# Patient Record
Sex: Male | Born: 1990 | Race: Black or African American | Hispanic: No | Marital: Single | Smoking: Current every day smoker
Health system: Southern US, Community
[De-identification: ages and names within clinical notes are randomized; demographics above are authoritative.]

## PROBLEM LIST (undated history)

## (undated) HISTORY — PX: MOUTH SURGERY: SHX715

---
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
## 2011-06-17 ENCOUNTER — Encounter (HOSPITAL_COMMUNITY): Payer: Self-pay

## 2011-06-17 ENCOUNTER — Emergency Department (HOSPITAL_COMMUNITY)
Admission: EM | Admit: 2011-06-17 | Discharge: 2011-06-18 | Disposition: A | Discharge status: Home / Self Care | Payer: Managed Care, Other (non HMO) | Attending: Emergency Medicine | Admitting: Emergency Medicine

## 2011-06-17 DIAGNOSIS — M546 Pain in thoracic spine: Secondary | ICD-10-CM | POA: Insufficient documentation

## 2011-06-17 MED ORDER — IBUPROFEN 200 MG PO TABS
600.0000 mg | ORAL_TABLET | Freq: Once | ORAL | Status: AC
  Administered 2011-06-17: 600 mg via ORAL

## 2011-06-17 MED ORDER — CYCLOBENZAPRINE HCL 10 MG PO TABS: 10.0000 mg | ORAL_TABLET | Freq: Two times a day (BID) | ORAL | Status: AC | PRN

## 2011-06-17 MED ORDER — IBUPROFEN 600 MG PO TABS: 600.0000 mg | ORAL_TABLET | Freq: Four times a day (QID) | ORAL | Status: AC | PRN
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
## 2011-06-19 NOTE — ED Provider Notes (Signed)
Medical screening examination/treatment/procedure(s) were performed by non-physician practitioner and as supervising physician I was immediately available for consultation/collaboration.  Sunnie Nielsen, MD 06/19/11 [phone]

## 2011-11-08 ENCOUNTER — Encounter (HOSPITAL_COMMUNITY): Payer: Self-pay | Admitting: Emergency Medicine

## 2011-11-08 ENCOUNTER — Emergency Department (HOSPITAL_COMMUNITY)
Admission: EM | Admit: 2011-11-08 | Discharge: 2011-11-08 | Disposition: A | Discharge status: Home / Self Care | Payer: No Typology Code available for payment source | Attending: Emergency Medicine | Admitting: Emergency Medicine

## 2011-11-08 DIAGNOSIS — IMO0002 Reserved for concepts with insufficient information to code with codable children: Secondary | ICD-10-CM | POA: Insufficient documentation

## 2011-11-08 DIAGNOSIS — T148XXA Other injury of unspecified body region, initial encounter: Secondary | ICD-10-CM

## 2011-11-08 DIAGNOSIS — M25519 Pain in unspecified shoulder: Secondary | ICD-10-CM | POA: Insufficient documentation

## 2011-11-08 MED ORDER — IBUPROFEN 600 MG PO TABS: 600.0000 mg | ORAL_TABLET | Freq: Four times a day (QID) | ORAL | Status: DC | PRN
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
## 2011-11-08 NOTE — ED Provider Notes (Signed)
Medical screening examination/treatment/procedure(s) were performed by non-physician practitioner and as supervising physician I was immediately available for consultation/collaboration.   Lyanne Co, MD 11/08/11 1455
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
## 2013-07-03 ENCOUNTER — Encounter (HOSPITAL_COMMUNITY): Payer: Self-pay | Admitting: Emergency Medicine

## 2013-07-03 ENCOUNTER — Emergency Department (HOSPITAL_COMMUNITY)
Admission: EM | Admit: 2013-07-03 | Discharge: 2013-07-03 | Disposition: A | Discharge status: Home / Self Care | Payer: Managed Care, Other (non HMO) | Attending: Emergency Medicine | Admitting: Emergency Medicine

## 2013-07-03 DIAGNOSIS — Z79899 Other long term (current) drug therapy: Secondary | ICD-10-CM | POA: Insufficient documentation

## 2013-07-03 DIAGNOSIS — K529 Noninfective gastroenteritis and colitis, unspecified: Secondary | ICD-10-CM

## 2013-07-03 DIAGNOSIS — K5289 Other specified noninfective gastroenteritis and colitis: Secondary | ICD-10-CM | POA: Insufficient documentation

## 2013-07-03 LAB — COMPREHENSIVE METABOLIC PANEL
ALT: 15 U/L (ref 0–53)
AST: 23 U/L (ref 0–37)
Albumin: 4.3 g/dL (ref 3.5–5.2)
Alkaline Phosphatase: 44 U/L (ref 39–117)
BILIRUBIN TOTAL: 0.5 mg/dL (ref 0.3–1.2)
BUN: 14 mg/dL (ref 6–23)
CALCIUM: 9.6 mg/dL (ref 8.4–10.5)
CHLORIDE: 102 meq/L (ref 96–112)
CO2: 27 meq/L (ref 19–32)
Creatinine, Ser: 1.04 mg/dL (ref 0.50–1.35)
GFR calc non Af Amer: >90 mL/min (ref >90)
Glucose, Bld: 93 mg/dL (ref 70–99)
POTASSIUM: 3.8 meq/L (ref 3.7–5.3)
SODIUM: 141 meq/L (ref 137–147)
Total Protein: 7.6 g/dL (ref 6.0–8.3)

## 2013-07-03 LAB — CBC WITH DIFFERENTIAL/PLATELET
Basophils Absolute: 0 10*3/uL (ref 0.0–0.1)
Basophils Relative: 1 % (ref 0–1)
Eosinophils Absolute: 0.1 10*3/uL (ref 0.0–0.7)
Eosinophils Relative: 1 % (ref 0–5)
HEMATOCRIT: 39.6 % (ref 39.0–52.0)
HEMOGLOBIN: 13.1 g/dL (ref 13.0–17.0)
LYMPHS ABS: 1.6 10*3/uL (ref 0.7–4.0)
LYMPHS PCT: 23 % (ref 12–46)
MCH: 27.9 pg (ref 26.0–34.0)
MCHC: 33.1 g/dL (ref 30.0–36.0)
MCV: 84.3 fL (ref 78.0–100.0)
MONO ABS: 0.6 10*3/uL (ref 0.1–1.0)
Monocytes Relative: 9 % (ref 3–12)
Neutro Abs: 4.7 10*3/uL (ref 1.7–7.7)
Neutrophils Relative %: 66 % (ref 43–77)
Platelets: 245 10*3/uL (ref 150–400)
RBC: 4.7 MIL/uL (ref 4.22–5.81)
RDW: 12.4 % (ref 11.5–15.5)
WBC: 7 10*3/uL (ref 4.0–10.5)

## 2013-07-03 LAB — URINALYSIS, ROUTINE W REFLEX MICROSCOPIC
BILIRUBIN URINE: NEGATIVE
Glucose, UA: NEGATIVE mg/dL
HGB URINE DIPSTICK: NEGATIVE
KETONES UR: NEGATIVE mg/dL
Leukocytes, UA: NEGATIVE
Nitrite: NEGATIVE
PH: 6.5 (ref 5.0–8.0)
PROTEIN: NEGATIVE mg/dL
SPECIFIC GRAVITY, URINE: 1.023 (ref 1.005–1.030)
Urobilinogen, UA: 1 mg/dL (ref 0.0–1.0)

## 2013-07-03 MED ORDER — ONDANSETRON HCL 4 MG/2ML IJ SOLN
4.0000 mg | Freq: Once | INTRAMUSCULAR | Status: AC
  Administered 2013-07-03: 4 mg via INTRAVENOUS
  Filled 2013-07-03: qty 2

## 2013-07-03 MED ORDER — SODIUM CHLORIDE 0.9 % IV BOLUS (SEPSIS)
1000.0000 mL | Freq: Once | INTRAVENOUS | Status: AC
  Administered 2013-07-03: 1000 mL via INTRAVENOUS

## 2013-07-03 NOTE — ED Notes (Signed)
Pharmacy at bedside

## 2013-07-03 NOTE — ED Notes (Signed)
Patient reports that he ate from cook out recently and now he is vomiting and having gagging with attempts at eating.

## 2013-07-03 NOTE — ED Notes (Signed)
Patient aware we need a urine sample. Patient given urinal. Requested to notify staff when specimen is ready.

## 2013-07-03 NOTE — ED Notes (Signed)
Patient reports eating last night at cookout (@1230am ) and began vomiting and having diarrhea about an hour later. Patient attempted to drink milk to calm his stomach. Patient actively vomiting on arrival to treatment room.

## 2013-07-03 NOTE — ED Notes (Signed)
Patient given gingerale for PO challenge.

## 2013-07-03 NOTE — ED Provider Notes (Addendum)
CSN: [id number]     Arrival date & time 07/03/13  1015 History   First MD Initiated Contact with Patient 07/03/13 1156     Chief Complaint  Patient presents with  . Emesis     (Consider location/radiation/quality/duration/timing/severity/associated sxs/prior Treatment) Patient is a 23 y.o. male presenting with vomiting. The history is provided by the patient.  Emesis Severity:  Severe Duration:  12 hours Timing:  Constant Quality:  Stomach contents Progression:  Improving Chronicity:  New Recent urination:  Normal Relieved by:  Nothing Worsened by:  Liquids Associated symptoms: chills and diarrhea   Associated symptoms: no abdominal pain, no fever and no headaches   Risk factors: no alcohol use, no diabetes, no prior abdominal surgery and no suspect food intake     History reviewed. No pertinent past medical history. Past Surgical History  Procedure Laterality Date  . Mouth surgery     Family History  Problem Relation Age of Onset  . Hypertension Other   . Diabetes Other    History  Substance Use Topics  . Smoking status: Never Smoker   . Smokeless tobacco: Not on file  . Alcohol Use: No    Review of Systems  Constitutional: Positive for chills.  Gastrointestinal: Positive for vomiting and diarrhea. Negative for abdominal pain.  Neurological: Negative for headaches.  All other systems reviewed and are negative.     Allergies  Review of patient's allergies indicates no known allergies.  Home Medications   Prior to Admission medications   Medication Sig Start Date End Date Taking? Authorizing Provider  bismuth subsalicylate (PEPTO BISMOL) 262 MG chewable tablet Chew 524 mg by mouth once.   Yes Historical Provider, MD  bismuth subsalicylate (PEPTO BISMOL) 262 MG/15ML suspension Take 30 mLs by mouth once.   Yes Historical Provider, MD  loratadine (CLARITIN) 10 MG tablet Take 10 mg by mouth daily.   Yes Historical Provider, MD  OVER THE COUNTER MEDICATION Take  1 tablet by mouth once. Pill for diarrhea, unsure of name   Yes Historical Provider, MD   BP 137/100  Pulse 95  Temp(Src) 98.7 F (37.1 C) (Oral)  Resp 16  SpO2 100% Physical Exam  Nursing note and vitals reviewed. Constitutional: He is oriented to person, place, and time. He appears well-developed and well-nourished. No distress.  HENT:  Head: Normocephalic and atraumatic.  Mouth/Throat: Oropharynx is clear and moist. Mucous membranes are dry.  Eyes: Conjunctivae and EOM are normal. Pupils are equal, round, and reactive to light.  Neck: Normal range of motion. Neck supple.  Cardiovascular: Normal rate, regular rhythm and intact distal pulses.   No murmur heard. Pulmonary/Chest: Effort normal and breath sounds normal. No respiratory distress. He has no wheezes. He has no rales.  Abdominal: Soft. He exhibits no distension. There is no tenderness. There is no rebound and no guarding.  Musculoskeletal: Normal range of motion. He exhibits no edema and no tenderness.  Neurological: He is alert and oriented to person, place, and time.  Skin: Skin is warm and dry. No rash noted. No erythema.  Psychiatric: He has a normal mood and affect. His behavior is normal.    ED Course  Procedures (including critical care time) Labs Review Labs Reviewed  CBC WITH DIFFERENTIAL  COMPREHENSIVE METABOLIC PANEL  URINALYSIS, ROUTINE W REFLEX MICROSCOPIC    Imaging Review No results found.   EKG Interpretation None      MDM   Final diagnoses:  Gastroenteritis    Pt with symptoms most consistent  with a viral process with fever/vomitting/diarrhea.  Denies bad food exposure and recent travel out of the country.  No recent abx.  No hx concerning for GU pathology or kidney stones.  Pt is awake and alert on exam without peritoneal signs.  Pt given fluids and zofran and will recheck  2:04 PM Patient much better after IV fluids and Zofran. By mouth challenge without difficulty and discharged  home  Gwyneth Sprout, MD 07/03/13 1405  Gwyneth Sprout, MD 07/03/13 1406

## 2013-07-03 NOTE — ED Notes (Signed)
MD notified patient has passed his PO challenge.

## 2018-06-03 ENCOUNTER — Emergency Department (HOSPITAL_COMMUNITY): Payer: Self-pay

## 2018-06-03 ENCOUNTER — Encounter (HOSPITAL_COMMUNITY): Payer: Self-pay | Admitting: Emergency Medicine

## 2018-06-03 ENCOUNTER — Emergency Department (HOSPITAL_COMMUNITY)
Admission: EM | Admit: 2018-06-03 | Discharge: 2018-06-04 | Disposition: A | Discharge status: Home / Self Care | Payer: Self-pay | Attending: Emergency Medicine | Admitting: Emergency Medicine

## 2018-06-03 DIAGNOSIS — Y939 Activity, unspecified: Secondary | ICD-10-CM | POA: Insufficient documentation

## 2018-06-03 DIAGNOSIS — F17210 Nicotine dependence, cigarettes, uncomplicated: Secondary | ICD-10-CM | POA: Insufficient documentation

## 2018-06-03 DIAGNOSIS — Y92410 Unspecified street and highway as the place of occurrence of the external cause: Secondary | ICD-10-CM | POA: Insufficient documentation

## 2018-06-03 DIAGNOSIS — M25511 Pain in right shoulder: Secondary | ICD-10-CM | POA: Insufficient documentation

## 2018-06-03 DIAGNOSIS — Y999 Unspecified external cause status: Secondary | ICD-10-CM | POA: Insufficient documentation

## 2018-06-03 MED ORDER — ACETAMINOPHEN 500 MG PO TABS
1000.0000 mg | ORAL_TABLET | Freq: Once | ORAL | Status: AC
  Administered 2018-06-03: 1000 mg via ORAL
  Filled 2018-06-03: qty 2

## 2018-06-03 MED ORDER — IBUPROFEN 800 MG PO TABS
800.0000 mg | ORAL_TABLET | Freq: Once | ORAL | Status: AC
Start: 2018-06-03 — End: 2018-06-03
  Administered 2018-06-03: 800 mg via ORAL
  Filled 2018-06-03: qty 1

## 2018-06-03 NOTE — ED Notes (Signed)
Pt ambulatory to the bathroom with steady gait and w/o assistance  Pt encouraged to provide urine sample just in case it's ordered and pt refused RN Chevis Pretty B aware
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
## 2018-06-04 ENCOUNTER — Encounter (HOSPITAL_COMMUNITY): Payer: Self-pay | Admitting: Emergency Medicine

## 2018-06-04 ENCOUNTER — Emergency Department (HOSPITAL_COMMUNITY): Payer: Self-pay

## 2018-06-04 MED ORDER — IBUPROFEN 800 MG PO TABS: 800.0000 mg | ORAL_TABLET | Freq: Three times a day (TID) | ORAL | 0 refills | Status: AC

## 2018-07-17 ENCOUNTER — Emergency Department (HOSPITAL_COMMUNITY)
Admission: EM | Admit: 2018-07-17 | Discharge: 2018-07-18 | Disposition: A | Discharge status: Home / Self Care | Payer: Self-pay | Attending: Emergency Medicine | Admitting: Emergency Medicine

## 2018-07-17 ENCOUNTER — Encounter (HOSPITAL_COMMUNITY): Payer: Self-pay

## 2018-07-17 ENCOUNTER — Other Ambulatory Visit: Payer: Self-pay

## 2018-07-17 DIAGNOSIS — R4182 Altered mental status, unspecified: Secondary | ICD-10-CM | POA: Insufficient documentation

## 2018-07-17 DIAGNOSIS — F17210 Nicotine dependence, cigarettes, uncomplicated: Secondary | ICD-10-CM | POA: Insufficient documentation

## 2018-07-17 DIAGNOSIS — D72829 Elevated white blood cell count, unspecified: Secondary | ICD-10-CM | POA: Insufficient documentation

## 2018-07-17 DIAGNOSIS — R7989 Other specified abnormal findings of blood chemistry: Secondary | ICD-10-CM | POA: Insufficient documentation

## 2018-07-17 LAB — CBC WITH DIFFERENTIAL/PLATELET
Abs Immature Granulocytes: 0.11 10*3/uL — ABNORMAL HIGH (ref 0.00–0.07)
Basophils Absolute: 0.1 10*3/uL (ref 0.0–0.1)
Basophils Relative: 0 %
Eosinophils Absolute: 0 10*3/uL (ref 0.0–0.5)
Eosinophils Relative: 0 %
HCT: 41.2 % (ref 39.0–52.0)
Hemoglobin: 13.3 g/dL (ref 13.0–17.0)
Immature Granulocytes: 1 %
Lymphocytes Relative: 7 %
Lymphs Abs: 1.5 10*3/uL (ref 0.7–4.0)
MCH: 28.5 pg (ref 26.0–34.0)
MCHC: 32.3 g/dL (ref 30.0–36.0)
MCV: 88.2 fL (ref 80.0–100.0)
Monocytes Absolute: 1.5 10*3/uL — ABNORMAL HIGH (ref 0.1–1.0)
Monocytes Relative: 7 %
Neutro Abs: 19.4 10*3/uL — ABNORMAL HIGH (ref 1.7–7.7)
Neutrophils Relative %: 85 %
Platelets: 252 10*3/uL (ref 150–400)
RBC: 4.67 MIL/uL (ref 4.22–5.81)
RDW: 11.9 % (ref 11.5–15.5)
WBC: 22.6 10*3/uL — ABNORMAL HIGH (ref 4.0–10.5)
nRBC: 0 % (ref 0.0–0.2)

## 2018-07-17 MED ORDER — SODIUM CHLORIDE 0.9 % IV BOLUS
1000.0000 mL | Freq: Once | INTRAVENOUS | Status: AC
  Administered 2018-07-17 – 2018-07-18 (×2): 1000 mL via INTRAVENOUS

## 2018-07-17 MED ORDER — LORAZEPAM 2 MG/ML IJ SOLN: 2.0000 mg | Freq: Once | INTRAMUSCULAR | Status: DC | PRN

## 2018-07-17 NOTE — ED Notes (Signed)
Bed: RESB Expected date:  Expected time:  Means of arrival:  Comments: 53M Psych
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
## 2018-07-18 ENCOUNTER — Emergency Department (HOSPITAL_COMMUNITY): Payer: Self-pay

## 2018-07-18 ENCOUNTER — Other Ambulatory Visit: Payer: Self-pay

## 2018-07-18 LAB — COMPREHENSIVE METABOLIC PANEL
ALT: 16 U/L (ref 0–44)
AST: 27 U/L (ref 15–41)
Albumin: 5.1 g/dL — ABNORMAL HIGH (ref 3.5–5.0)
Alkaline Phosphatase: 35 U/L — ABNORMAL LOW (ref 38–126)
Anion gap: 14 (ref 5–15)
BUN: 18 mg/dL (ref 6–20)
CO2: 24 mmol/L (ref 22–32)
Calcium: 9.7 mg/dL (ref 8.9–10.3)
Chloride: 103 mmol/L (ref 98–111)
Creatinine, Ser: 1.64 mg/dL — ABNORMAL HIGH (ref 0.61–1.24)
GFR calc Af Amer: >60 mL/min (ref >60)
GFR calc non Af Amer: 56 mL/min — ABNORMAL LOW (ref >60)
Glucose, Bld: 98 mg/dL (ref 70–99)
Potassium: 3.3 mmol/L — ABNORMAL LOW (ref 3.5–5.1)
Sodium: 141 mmol/L (ref 135–145)
Total Bilirubin: 0.5 mg/dL (ref 0.3–1.2)
Total Protein: 8.4 g/dL — ABNORMAL HIGH (ref 6.5–8.1)

## 2018-07-18 LAB — URINALYSIS, ROUTINE W REFLEX MICROSCOPIC
Bilirubin Urine: NEGATIVE
Glucose, UA: NEGATIVE mg/dL
Hgb urine dipstick: NEGATIVE
Ketones, ur: NEGATIVE mg/dL
Leukocytes,Ua: NEGATIVE
Nitrite: NEGATIVE
Protein, ur: NEGATIVE mg/dL
Specific Gravity, Urine: 1.02 (ref 1.005–1.030)
pH: 6 (ref 5.0–8.0)

## 2018-07-18 LAB — SALICYLATE LEVEL: Salicylate Lvl: <7 mg/dL (ref 2.8–30.0)

## 2018-07-18 LAB — RAPID URINE DRUG SCREEN, HOSP PERFORMED
Amphetamines: NOT DETECTED
Barbiturates: NOT DETECTED
Benzodiazepines: NOT DETECTED
Cocaine: NOT DETECTED
Opiates: NOT DETECTED
Tetrahydrocannabinol: POSITIVE — AB

## 2018-07-18 LAB — ACETAMINOPHEN LEVEL: Acetaminophen (Tylenol), Serum: <10 ug/mL — ABNORMAL LOW (ref 10–30)

## 2018-07-18 LAB — ETHANOL: Alcohol, Ethyl (B): <10 mg/dL (ref <10)

## 2018-07-18 MED ORDER — POTASSIUM CHLORIDE CRYS ER 20 MEQ PO TBCR
40.0000 meq | EXTENDED_RELEASE_TABLET | Freq: Once | ORAL | Status: AC
  Administered 2018-07-18: 40 meq via ORAL
  Filled 2018-07-18: qty 2

## 2018-07-18 NOTE — Discharge Instructions (Signed)
It was my pleasure taking care of you today!   Please call your primary care doctor on Monday to schedule a follow up appointment. I would like you to get your blood work rechecked to make sure it improved.   Please return to ER should you have another similar episode, develop a fever, trouble breathing, abdominal pain, new symptoms or any additional concerns.

## 2018-07-18 NOTE — ED Notes (Signed)
3 attempts to reach mother and address her concerns but no answer
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
## 2019-11-20 ENCOUNTER — Other Ambulatory Visit: Payer: Self-pay

## 2019-11-20 ENCOUNTER — Emergency Department (INDEPENDENT_AMBULATORY_CARE_PROVIDER_SITE_OTHER)
Admission: EM | Admit: 2019-11-20 | Discharge: 2019-11-20 | Disposition: A | Discharge status: Home / Self Care | Payer: 59 | Source: Home / Self Care

## 2019-11-20 ENCOUNTER — Encounter: Payer: Self-pay | Admitting: Emergency Medicine

## 2019-11-20 DIAGNOSIS — K6289 Other specified diseases of anus and rectum: Secondary | ICD-10-CM | POA: Diagnosis not present

## 2019-11-20 DIAGNOSIS — K644 Residual hemorrhoidal skin tags: Secondary | ICD-10-CM

## 2019-11-20 DIAGNOSIS — K5900 Constipation, unspecified: Secondary | ICD-10-CM

## 2019-11-20 DIAGNOSIS — K5641 Fecal impaction: Secondary | ICD-10-CM

## 2019-11-20 MED ORDER — DOCUSATE SODIUM 250 MG PO CAPS: 250.0000 mg | ORAL_CAPSULE | Freq: Every day | ORAL | 0 refills | Status: AC

## 2019-11-20 MED ORDER — POLYETHYLENE GLYCOL 3350 17 G PO PACK: 17.0000 g | PACK | Freq: Every day | ORAL | 0 refills | Status: AC

## 2019-11-20 MED ORDER — HYDROCORTISONE ACETATE 25 MG RE SUPP: 25.0000 mg | Freq: Two times a day (BID) | RECTAL | 0 refills | Status: AC | PRN

## 2019-11-20 NOTE — ED Triage Notes (Signed)
Patient c/o possible internal hemorrhoids, no bleeding, really sore x 3-4 days, hurts to have a BM, taken OTC hemorrhoid cream and suppository, no relief, hot baths.

## 2019-11-20 NOTE — ED Provider Notes (Signed)
[facility]    CSN: [id number] Arrival date & time: 11/20/19  1442      History   Chief Complaint Chief Complaint  Patient presents with  . Hemorhoids    HPI Nain D Wohl is a 29 y.o. male.   HPI Cray D Myhand is a 29 y.o. male presenting to UC with c/o rectal pain that started about 3-4 days ago.  Pt states he feels "backed up" and has not tried to have a BM because it hurts to do so but states it does not feel like he is straining when he does have a BM.  He has used OTC topical hemorrhoid cream and suppository with no relief but states warm bath did help sooth some discomfort. He has not tried nay stool softeners or laxatives. Denies fever, chills, n/v/d. No abdominal pain at this time. Hx of hemorrhoids in the past. He does not have a PCP.   History reviewed. No pertinent past medical history.  There are no problems to display for this patient.   Past Surgical History:  Procedure Laterality Date  . MOUTH SURGERY         Home Medications    Prior to Admission medications   Medication Sig Start Date End Date Taking? Authorizing Provider  docusate sodium (COLACE) 250 MG capsule Take 1 capsule (250 mg total) by mouth daily. 11/20/19   Lurene Shadow, PA-C  hydrocortisone (ANUSOL-HC) 25 MG suppository Place 1 suppository (25 mg total) rectally 2 (two) times daily as needed for hemorrhoids or anal itching. For 7 days 11/20/19   Lurene Shadow, PA-C  ibuprofen (ADVIL) 800 MG tablet Take 1 tablet (800 mg total) by mouth 3 (three) times daily. Patient not taking: Reported on 07/18/2018 06/04/18   Palumbo, April, MD  polyethylene glycol (MIRALAX / GLYCOLAX) 17 g packet Take 17 g by mouth daily. 11/20/19   Lurene Shadow, PA-C    Family History Family History  Problem Relation Age of Onset  . Hypertension Other   . Diabetes Other     Social History Social History   Tobacco Use  . Smoking status: Current Every Day Smoker    Types:  Cigarettes  . Smokeless tobacco: Never Used  Substance Use Topics  . Alcohol use: No  . Drug use: Not on file     Allergies   Patient has no known allergies.   Review of Systems Review of Systems  Constitutional: Negative for chills and fever.  Gastrointestinal: Positive for constipation and rectal pain. Negative for abdominal distention, abdominal pain, anal bleeding, blood in stool, diarrhea, nausea and vomiting.  Musculoskeletal: Negative for back pain.     Physical Exam Triage Vital Signs ED Triage Vitals [11/20/19 1518]  Enc Vitals Group     BP (!) 155/89     Pulse Rate 93     Resp      Temp      Temp src      SpO2 99 %     Weight      Height      Head Circumference      Peak Flow      Pain Score 9     Pain Loc      Pain Edu?      Excl. in GC?    No data found.  Updated Vital Signs BP (!) 155/89 (BP Location: Left Arm)   Pulse 93   SpO2 99%   Visual Acuity Right Eye Distance:  Left Eye Distance:   Bilateral Distance:    Right Eye Near:   Left Eye Near:    Bilateral Near:     Physical Exam Vitals and nursing note reviewed. Exam conducted with a chaperone present.  Constitutional:      General: He is not in acute distress.    Appearance: Normal appearance. He is well-developed. He is not ill-appearing.  HENT:     Head: Normocephalic and atraumatic.  Cardiovascular:     Rate and Rhythm: Normal rate and regular rhythm.  Pulmonary:     Effort: Pulmonary effort is normal. No respiratory distress.     Breath sounds: Normal breath sounds.  Abdominal:     General: There is no distension.     Palpations: Abdomen is soft.     Tenderness: There is no abdominal tenderness. There is no right CVA tenderness or left CVA tenderness.  Genitourinary:    Rectum: Tenderness and external hemorrhoid (1cm, non-thrombosed) present.     Comments: Stool mass palpated during DRE, no blood noted  Musculoskeletal:        General: Normal range of motion.      Cervical back: Normal range of motion.  Skin:    General: Skin is warm and dry.  Neurological:     Mental Status: He is alert and oriented to person, place, and time.  Psychiatric:        Behavior: Behavior normal.      UC Treatments / Results  Labs (all labs ordered are listed, but only abnormal results are displayed) Labs Reviewed - No data to display  EKG   Radiology No results found.  Procedures Procedures (including critical care time)  Medications Ordered in UC Medications - No data to display  Initial Impression / Assessment and Plan / UC Course  I have reviewed the triage vital signs and the nursing notes.  Pertinent labs & imaging results that were available during my care of the patient were reviewed by me and considered in my medical decision making (see chart for details).    Abdominal exam: benign  Hx and exam c/w fecal impaction, constipation and small external hemorrhoid Prescriptions sent to help soften stool and help ease discomfort Encouraged to establish care with a PCP and f/u with [facility]S given   Final Clinical Impressions(s) / UC Diagnoses   Final diagnoses:  Rectal pain  Fecal impaction (HCC)  Constipation, unspecified constipation type  External hemorrhoid     Discharge Instructions      Please take your medication as prescribed to help with your symptoms.  Call to schedule an appointment to establish care with a primary care provider as well as with a stomach specialist for further evaluation and treatment of your symptoms.     ED Prescriptions    Medication Sig Dispense Auth. Provider   hydrocortisone (ANUSOL-HC) 25 MG suppository Place 1 suppository (25 mg total) rectally 2 (two) times daily as needed for hemorrhoids or anal itching. For 7 days 14 suppository Temple Sporer O, PA-C   polyethylene glycol (MIRALAX / GLYCOLAX) 17 g packet Take 17 g by mouth daily. 14 each Lurene Shadow, PA-C   docusate sodium (COLACE)  250 MG capsule Take 1 capsule (250 mg total) by mouth daily. 10 capsule Lurene Shadow, PA-C     PDMP not reviewed this encounter.   Lurene Shadow, [place] 11/21/19 1923

## 2019-11-20 NOTE — Discharge Instructions (Signed)
°  Please take your medication as prescribed to help with your symptoms.  Call to schedule an appointment to establish care with a primary care provider as well as with a stomach specialist for further evaluation and treatment of your symptoms.
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
## 2021-09-22 ENCOUNTER — Other Ambulatory Visit: Payer: Self-pay

## 2021-09-22 NOTE — Congregational Nurse Program (Signed)
Seen in health fair at [facility].  Recommended followup.  Will check blood pressure in a week.  Recommended low sodium diet.  Will help find pcp if none.   Marylynne Keelin, RN, Congregational Nurse, [phone]. 

## 2021-09-22 NOTE — Progress Notes (Signed)
Seen in health fair at [facility].  Recommended followup.  Will check blood pressure in a week.  Recommended low sodium diet.  Will help find pcp if none.   Juliann Pulse, RN, Congregational Nurse, [phone].

## 2021-09-23 ENCOUNTER — Other Ambulatory Visit: Payer: Self-pay

## 2021-09-23 DIAGNOSIS — Z131 Encounter for screening for diabetes mellitus: Secondary | ICD-10-CM

## 2021-09-23 LAB — GLUCOSE, POCT (MANUAL RESULT ENTRY): POC Glucose: 94 mg/dl (ref 70–99)

## 2021-09-25 NOTE — Congregational Nurse Program (Signed)
Seen at [facility] screening clinic.  BP reading discussed, e.  Followup with me.  Juliann Pulse, RN Congregational Nurse [phone]
